# Patient Record
Sex: Male | Born: 1960 | Race: White | Hispanic: No | Marital: Single | Smoking: Current every day smoker
Health system: Southern US, Community
[De-identification: ages and names within clinical notes are randomized; demographics above are authoritative.]

## PROBLEM LIST (undated history)

## (undated) HISTORY — PX: CHOLECYSTECTOMY: SHX55

## (undated) HISTORY — PX: APPENDECTOMY: SHX54

---

## 2017-02-08 ENCOUNTER — Other Ambulatory Visit (HOSPITAL_COMMUNITY): Payer: Self-pay | Admitting: Orthopedic Surgery

## 2017-02-08 DIAGNOSIS — IMO0002 Reserved for concepts with insufficient information to code with codable children: Secondary | ICD-10-CM

## 2017-02-08 DIAGNOSIS — R229 Localized swelling, mass and lump, unspecified: Principal | ICD-10-CM

## 2017-02-10 ENCOUNTER — Encounter (HOSPITAL_COMMUNITY): Payer: Self-pay

## 2017-02-10 ENCOUNTER — Ambulatory Visit (HOSPITAL_COMMUNITY)
Admission: RE | Admit: 2017-02-10 | Discharge: 2017-02-10 | Disposition: A | Discharge status: Home / Self Care | Payer: Self-pay | Source: Ambulatory Visit | Attending: Orthopedic Surgery | Admitting: Orthopedic Surgery

## 2017-02-16 ENCOUNTER — Ambulatory Visit (HOSPITAL_COMMUNITY)
Admission: RE | Admit: 2017-02-16 | Discharge: 2017-02-16 | Disposition: A | Discharge status: Home / Self Care | Payer: Self-pay | Source: Ambulatory Visit | Attending: Orthopedic Surgery | Admitting: Orthopedic Surgery

## 2017-02-16 DIAGNOSIS — R229 Localized swelling, mass and lump, unspecified: Secondary | ICD-10-CM

## 2017-02-16 DIAGNOSIS — R2232 Localized swelling, mass and lump, left upper limb: Secondary | ICD-10-CM | POA: Insufficient documentation

## 2017-02-16 DIAGNOSIS — IMO0002 Reserved for concepts with insufficient information to code with codable children: Secondary | ICD-10-CM

## 2017-04-06 ENCOUNTER — Other Ambulatory Visit: Payer: Self-pay | Admitting: Orthopedic Surgery

## 2017-04-06 ENCOUNTER — Encounter (HOSPITAL_BASED_OUTPATIENT_CLINIC_OR_DEPARTMENT_OTHER): Payer: Self-pay | Admitting: *Deleted

## 2017-04-13 ENCOUNTER — Encounter (HOSPITAL_BASED_OUTPATIENT_CLINIC_OR_DEPARTMENT_OTHER): Payer: Self-pay | Admitting: *Deleted

## 2017-04-13 ENCOUNTER — Ambulatory Visit (HOSPITAL_BASED_OUTPATIENT_CLINIC_OR_DEPARTMENT_OTHER): Payer: Self-pay | Admitting: Anesthesiology

## 2017-04-13 ENCOUNTER — Ambulatory Visit (HOSPITAL_BASED_OUTPATIENT_CLINIC_OR_DEPARTMENT_OTHER)
Admission: RE | Admit: 2017-04-13 | Discharge: 2017-04-13 | Disposition: A | Discharge status: Home / Self Care | Payer: Self-pay | Source: Ambulatory Visit | Attending: Orthopedic Surgery | Admitting: Orthopedic Surgery

## 2017-04-13 ENCOUNTER — Encounter (HOSPITAL_BASED_OUTPATIENT_CLINIC_OR_DEPARTMENT_OTHER)
Admission: RE | Disposition: A | Discharge status: Home / Self Care | Payer: Self-pay | Source: Ambulatory Visit | Attending: Orthopedic Surgery

## 2017-04-13 DIAGNOSIS — L720 Epidermal cyst: Secondary | ICD-10-CM | POA: Insufficient documentation

## 2017-04-13 DIAGNOSIS — Z7982 Long term (current) use of aspirin: Secondary | ICD-10-CM | POA: Insufficient documentation

## 2017-04-13 DIAGNOSIS — F17210 Nicotine dependence, cigarettes, uncomplicated: Secondary | ICD-10-CM | POA: Insufficient documentation

## 2017-04-13 HISTORY — PX: EXCISION MASS UPPER EXTREMETIES: SHX6704

## 2017-04-13 SURGERY — EXCISION MASS UPPER EXTREMITIES
Anesthesia: General | Site: Hand | Laterality: Left

## 2017-04-13 MED ORDER — HYDROCODONE-ACETAMINOPHEN 5-325 MG PO TABS: ORAL_TABLET | ORAL | 0 refills | Status: AC

## 2017-04-13 MED ORDER — KETOROLAC TROMETHAMINE 30 MG/ML IJ SOLN
INTRAMUSCULAR | Status: DC | PRN
  Administered 2017-04-13: 30 mg via INTRAVENOUS

## 2017-04-13 MED ORDER — LIDOCAINE 2% (20 MG/ML) 5 ML SYRINGE
INTRAMUSCULAR | Status: DC | PRN
  Administered 2017-04-13: 100 mg via INTRAVENOUS

## 2017-04-13 MED ORDER — FENTANYL CITRATE (PF) 100 MCG/2ML IJ SOLN
INTRAMUSCULAR | Status: AC
  Filled 2017-04-13: qty 2

## 2017-04-13 MED ORDER — CEFAZOLIN SODIUM-DEXTROSE 2-4 GM/100ML-% IV SOLN
2.0000 g | INTRAVENOUS | Status: AC
  Administered 2017-04-13: 2 g via INTRAVENOUS

## 2017-04-13 MED ORDER — CEFAZOLIN SODIUM-DEXTROSE 2-4 GM/100ML-% IV SOLN
INTRAVENOUS | Status: AC
  Filled 2017-04-13: qty 100

## 2017-04-13 MED ORDER — MIDAZOLAM HCL 2 MG/2ML IJ SOLN
1.0000 mg | INTRAMUSCULAR | Status: DC | PRN
  Administered 2017-04-13: 2 mg via INTRAVENOUS

## 2017-04-13 MED ORDER — HEPARIN SODIUM (PORCINE) 1000 UNIT/ML IJ SOLN
INTRAMUSCULAR | Status: AC
  Filled 2017-04-13: qty 1

## 2017-04-13 MED ORDER — HYDROMORPHONE HCL 1 MG/ML IJ SOLN: 0.2500 mg | INTRAMUSCULAR | Status: DC | PRN

## 2017-04-13 MED ORDER — FENTANYL CITRATE (PF) 100 MCG/2ML IJ SOLN
50.0000 ug | INTRAMUSCULAR | Status: DC | PRN
  Administered 2017-04-13: 100 ug via INTRAVENOUS

## 2017-04-13 MED ORDER — CHLORHEXIDINE GLUCONATE 4 % EX LIQD: 60.0000 mL | Freq: Once | CUTANEOUS | Status: DC

## 2017-04-13 MED ORDER — LACTATED RINGERS IV SOLN
INTRAVENOUS | Status: DC
  Administered 2017-04-13 (×3): via INTRAVENOUS

## 2017-04-13 MED ORDER — BUPIVACAINE HCL (PF) 0.25 % IJ SOLN
INTRAMUSCULAR | Status: AC
  Filled 2017-04-13: qty 90

## 2017-04-13 MED ORDER — MIDAZOLAM HCL 2 MG/2ML IJ SOLN
INTRAMUSCULAR | Status: AC
  Filled 2017-04-13: qty 2

## 2017-04-13 MED ORDER — ONDANSETRON HCL 4 MG/2ML IJ SOLN
INTRAMUSCULAR | Status: AC
  Filled 2017-04-13: qty 2

## 2017-04-13 MED ORDER — OXYCODONE HCL 5 MG PO TABS: 5.0000 mg | ORAL_TABLET | Freq: Once | ORAL | Status: DC | PRN

## 2017-04-13 MED ORDER — DEXAMETHASONE SODIUM PHOSPHATE 10 MG/ML IJ SOLN
INTRAMUSCULAR | Status: AC
  Filled 2017-04-13: qty 1

## 2017-04-13 MED ORDER — MEPERIDINE HCL 25 MG/ML IJ SOLN: 6.2500 mg | INTRAMUSCULAR | Status: DC | PRN

## 2017-04-13 MED ORDER — PROMETHAZINE HCL 25 MG/ML IJ SOLN: 6.2500 mg | INTRAMUSCULAR | Status: DC | PRN

## 2017-04-13 MED ORDER — LIDOCAINE HCL (PF) 1 % IJ SOLN
INTRAMUSCULAR | Status: AC
  Filled 2017-04-13: qty 30

## 2017-04-13 MED ORDER — SCOPOLAMINE 1 MG/3DAYS TD PT72: 1.0000 | MEDICATED_PATCH | Freq: Once | TRANSDERMAL | Status: DC | PRN

## 2017-04-13 MED ORDER — PROPOFOL 10 MG/ML IV BOLUS
INTRAVENOUS | Status: DC | PRN
  Administered 2017-04-13: 200 mg via INTRAVENOUS

## 2017-04-13 MED ORDER — OXYCODONE HCL 5 MG/5ML PO SOLN: 5.0000 mg | Freq: Once | ORAL | Status: DC | PRN

## 2017-04-13 MED ORDER — BUPIVACAINE HCL (PF) 0.25 % IJ SOLN
INTRAMUSCULAR | Status: DC | PRN
  Administered 2017-04-13: 5 mL

## 2017-04-13 MED ORDER — DEXAMETHASONE SODIUM PHOSPHATE 4 MG/ML IJ SOLN
INTRAMUSCULAR | Status: DC | PRN
  Administered 2017-04-13: 10 mg via INTRAVENOUS

## 2017-04-13 MED ORDER — ONDANSETRON HCL 4 MG/2ML IJ SOLN
INTRAMUSCULAR | Status: DC | PRN
  Administered 2017-04-13: 4 mg via INTRAVENOUS

## 2017-04-13 SURGICAL SUPPLY — 56 items
APL SKNCLS STERI-STRIP NONHPOA (GAUZE/BANDAGES/DRESSINGS)
BANDAGE ACE 3X5.8 VEL STRL LF (GAUZE/BANDAGES/DRESSINGS) ×2 IMPLANT
BANDAGE COBAN STERILE 2 (GAUZE/BANDAGES/DRESSINGS) ×2 IMPLANT
BENZOIN TINCTURE PRP APPL 2/3 (GAUZE/BANDAGES/DRESSINGS) IMPLANT
BLADE MINI RND TIP GREEN BEAV (BLADE) IMPLANT
BLADE SURG 15 STRL LF DISP TIS (BLADE) ×2 IMPLANT
BLADE SURG 15 STRL SS (BLADE) ×6
BNDG CMPR 9X4 STRL LF SNTH (GAUZE/BANDAGES/DRESSINGS) ×1
BNDG COHESIVE 1X5 TAN STRL LF (GAUZE/BANDAGES/DRESSINGS) IMPLANT
BNDG CONFORM 2 STRL LF (GAUZE/BANDAGES/DRESSINGS) IMPLANT
BNDG ELASTIC 2X5.8 VLCR STR LF (GAUZE/BANDAGES/DRESSINGS) IMPLANT
BNDG ESMARK 4X9 LF (GAUZE/BANDAGES/DRESSINGS) ×2 IMPLANT
BNDG GAUZE 1X2.1 STRL (MISCELLANEOUS) IMPLANT
BNDG GAUZE ELAST 4 BULKY (GAUZE/BANDAGES/DRESSINGS) ×2 IMPLANT
BNDG PLASTER X FAST 3X3 WHT LF (CAST SUPPLIES) IMPLANT
BNDG PLSTR 9X3 FST ST WHT (CAST SUPPLIES)
CHLORAPREP W/TINT 26ML (MISCELLANEOUS) ×3 IMPLANT
CLOSURE WOUND 1/2 X4 (GAUZE/BANDAGES/DRESSINGS)
CORD BIPOLAR FORCEPS 12FT (ELECTRODE) ×3 IMPLANT
COVER BACK TABLE 60X90IN (DRAPES) ×3 IMPLANT
COVER MAYO STAND STRL (DRAPES) ×3 IMPLANT
CUFF TOURNIQUET SINGLE 18IN (TOURNIQUET CUFF) ×3 IMPLANT
DRAPE EXTREMITY T 121X128X90 (DRAPE) ×3 IMPLANT
DRAPE SURG 17X23 STRL (DRAPES) ×3 IMPLANT
GAUZE SPONGE 4X4 12PLY STRL (GAUZE/BANDAGES/DRESSINGS) ×3 IMPLANT
GAUZE XEROFORM 1X8 LF (GAUZE/BANDAGES/DRESSINGS) ×3 IMPLANT
GLOVE BIO SURGEON STRL SZ7 (GLOVE) ×2 IMPLANT
GLOVE BIO SURGEON STRL SZ7.5 (GLOVE) ×3 IMPLANT
GLOVE BIOGEL PI IND STRL 8 (GLOVE) ×1 IMPLANT
GLOVE BIOGEL PI INDICATOR 8 (GLOVE) ×2
GLOVE EXAM NITRILE MD LF STRL (GLOVE) ×2 IMPLANT
GOWN STRL REUS W/ TWL LRG LVL3 (GOWN DISPOSABLE) ×1 IMPLANT
GOWN STRL REUS W/ TWL XL LVL3 (GOWN DISPOSABLE) IMPLANT
GOWN STRL REUS W/TWL LRG LVL3 (GOWN DISPOSABLE)
GOWN STRL REUS W/TWL XL LVL3 (GOWN DISPOSABLE) ×6 IMPLANT
NDL HYPO 25X1 1.5 SAFETY (NEEDLE) ×1 IMPLANT
NEEDLE HYPO 25X1 1.5 SAFETY (NEEDLE) ×3 IMPLANT
NS IRRIG 1000ML POUR BTL (IV SOLUTION) ×3 IMPLANT
PACK BASIN DAY SURGERY FS (CUSTOM PROCEDURE TRAY) ×3 IMPLANT
PAD CAST 3X4 CTTN HI CHSV (CAST SUPPLIES) IMPLANT
PAD CAST 4YDX4 CTTN HI CHSV (CAST SUPPLIES) IMPLANT
PADDING CAST ABS 4INX4YD NS (CAST SUPPLIES) ×2
PADDING CAST ABS COTTON 4X4 ST (CAST SUPPLIES) ×1 IMPLANT
PADDING CAST COTTON 3X4 STRL (CAST SUPPLIES) ×3
PADDING CAST COTTON 4X4 STRL (CAST SUPPLIES)
STOCKINETTE 4X48 STRL (DRAPES) ×3 IMPLANT
STRIP CLOSURE SKIN 1/2X4 (GAUZE/BANDAGES/DRESSINGS) IMPLANT
SUT ETHILON 3 0 PS 1 (SUTURE) IMPLANT
SUT ETHILON 4 0 PS 2 18 (SUTURE) ×3 IMPLANT
SUT ETHILON 5 0 P 3 18 (SUTURE)
SUT NYLON ETHILON 5-0 P-3 1X18 (SUTURE) IMPLANT
SUT VIC AB 4-0 P2 18 (SUTURE) IMPLANT
SYR BULB 3OZ (MISCELLANEOUS) ×3 IMPLANT
SYR CONTROL 10ML LL (SYRINGE) ×3 IMPLANT
TOWEL OR 17X24 6PK STRL BLUE (TOWEL DISPOSABLE) ×6 IMPLANT
UNDERPAD 30X30 (UNDERPADS AND DIAPERS) ×3 IMPLANT

## 2017-04-13 NOTE — Brief Op Note (Signed)
04/13/2017  3:34 PM  PATIENT:  Sean LinseyJoseph V Busk  56 y.o. male  PRE-OPERATIVE DIAGNOSIS:  LEFT HAND MASS R22.32  POST-OPERATIVE DIAGNOSIS:  LEFT HAND MASS  PROCEDURE:  Procedure(s): EXCISION MASS LEFT HAND (Left)  SURGEON:  Surgeon(s) and Role:    * Betha LoaKuzma, Ajooni Karam, MD - Primary  PHYSICIAN ASSISTANT:   ASSISTANTS: none   ANESTHESIA:   general  EBL:  minimal  BLOOD ADMINISTERED:none  DRAINS: none   LOCAL MEDICATIONS USED:  MARCAINE     SPECIMEN:  Source of Specimen:  left palm  DISPOSITION OF SPECIMEN:  PATHOLOGY  COUNTS:  YES  TOURNIQUET:   Total Tourniquet Time Documented: Upper Arm (Left) - 26 minutes Total: Upper Arm (Left) - 26 minutes   DICTATION: .Other Dictation: Dictation Number [id number]  PLAN OF CARE: Discharge to home after PACU  PATIENT DISPOSITION:  PACU - hemodynamically stable.

## 2017-04-13 NOTE — Op Note (Signed)
[id number] 

## 2017-04-13 NOTE — H&P (Signed)
  Ancil LinseyJoseph V Hubbert is an 56 y.o. male.   Chief Complaint: left palm mass HPI: 56 yo male with mass in left palm x 5 years.  It is bothersome to him and he wishes to have it removed.  Allergies: No Known Allergies  History reviewed. No pertinent past medical history.  Past Surgical History:  Procedure Laterality Date  . APPENDECTOMY    . CHOLECYSTECTOMY      Family History: History reviewed. No pertinent family history.  Social History:   reports that he has been smoking.  He has never used smokeless tobacco. He reports that he does not drink alcohol or use drugs.  Medications: Medications Prior to Admission  Medication Sig Dispense Refill  . aspirin-acetaminophen-caffeine (EXCEDRIN MIGRAINE) 250-250-65 MG tablet Take by mouth every 6 (six) hours as needed for headache.      No results found for this or any previous visit (from the past 48 hour(s)).  No results found.   A comprehensive review of systems was negative.  Blood pressure 125/77, pulse 83, temperature 98.5 F (36.9 C), temperature source Oral, resp. rate 18, height 5\' 10"  (1.778 m), weight 104.8 kg (231 lb), SpO2 96 %.  General appearance: alert, cooperative and appears stated age Head: Normocephalic, without obvious abnormality, atraumatic Neck: supple, symmetrical, trachea midline Resp: clear to auscultation bilaterally Cardio: regular rate and rhythm GI: non-tender Extremities: Intact sensation and capillary refill all digits.  +epl/fpl/io.  No wounds.  Pulses: 2+ and symmetric Skin: Skin color, texture, turgor normal. No rashes or lesions Neurologic: Grossly normal Incision/Wound:none  Assessment/Plan Left palm mass.  Non operative and operative treatment options were discussed with the patient and patient wishes to proceed with operative treatment. Risks, benefits, and alternatives of surgery were discussed and the patient agrees with the plan of care.   Zaylynn Rickett R 04/13/2017, 1:43 PM

## 2017-04-13 NOTE — Anesthesia Preprocedure Evaluation (Signed)
Anesthesia Evaluation  Patient identified by MRN, date of birth, ID band Patient awake    Reviewed: Allergy & Precautions, NPO status , Patient's Chart, lab work & pertinent test results  Airway Mallampati: II  TM Distance: >3 FB Neck ROM: Full    Dental no notable dental hx.    Pulmonary neg pulmonary ROS, Current Smoker,    Pulmonary exam normal breath sounds clear to auscultation       Cardiovascular negative cardio ROS Normal cardiovascular exam Rhythm:Regular Rate:Normal     Neuro/Psych negative neurological ROS  negative psych ROS   GI/Hepatic negative GI ROS, Neg liver ROS,   Endo/Other  negative endocrine ROS  Renal/GU negative Renal ROS     Musculoskeletal negative musculoskeletal ROS (+)   Abdominal   Peds  Hematology negative hematology ROS (+)   Anesthesia Other Findings   Reproductive/Obstetrics                             Anesthesia Physical Anesthesia Plan  ASA: II  Anesthesia Plan: General   Post-op Pain Management:    Induction: Intravenous  PONV Risk Score and Plan: 1 and Ondansetron, Dexamethasone and Midazolam  Airway Management Planned: LMA  Additional Equipment:   Intra-op Plan:   Post-operative Plan: Extubation in OR  Informed Consent: I have reviewed the patients History and Physical, chart, labs and discussed the procedure including the risks, benefits and alternatives for the proposed anesthesia with the patient or authorized representative who has indicated his/her understanding and acceptance.   Dental advisory given  Plan Discussed with: CRNA  Anesthesia Plan Comments:         Anesthesia Quick Evaluation

## 2017-04-13 NOTE — Op Note (Signed)
NAMCharlett Nose:  Woodard, Sean               ACCOUNT NO.:  [id number]  MEDICAL RECORD NO.:  [id number]  LOCATION:                                 FACILITY:  PHYSICIAN:  Betha LoaKevin Finola Rosal, MD             DATE OF BIRTH:  DATE OF PROCEDURE:  04/13/2017 DATE OF DISCHARGE:                              OPERATIVE REPORT   POSTOPERATIVE DIAGNOSIS:  Left palm mass.  POSTOP DIAGNOSIS:  Left palm inclusion cyst, 23 mm.  PROCEDURE:  Excision of subcutaneous mass left palm, 23 mm.  SURGEON:  Betha LoaKevin Floree Zuniga, MD.  ASSISTANT:  None.  ANESTHESIA:  General.  IV FLUIDS:  Per anesthesia flow sheet.  ESTIMATED BLOOD LOSS:  Minimal.  COMPLICATIONS:  None.  SPECIMENS:  Left palm mass to Pathology.  TOURNIQUET TIME:  26 minutes.  DISPOSITION:  Stable to PACU.  INDICATIONS:  Mr. Sean Woodard is a 56 year old male, who has noted a mass in his left palm for several years.  It is bothersome to him.  He wishes to have it removed.  Risks, benefits, and alternatives of surgery were discussed including risk of blood loss; infection; damage to nerves, vessels, tendons, ligaments, bone; failure of surgery; need for additional surgery; complications with wound healing; continued pain; and recurrence of mass.  He voiced understanding of these risks and elected to proceed.  OPERATIVE COURSE:  After being identified preoperatively by myself, the patient and I agreed upon the procedure and site of procedure.  Surgical site was marked.  The risks, benefits, and alternatives of surgery were reviewed and he wished to proceed.  Surgical consent had been signed. He was given IV Ancef as preoperative antibiotic prophylaxis.  He was transported to the operating room and placed on the operating room table in supine position with left upper extremity on arm board.  General anesthesia was induced by anesthesiologist.  Left upper extremity was prepped and draped in normal sterile orthopedic fashion.  Surgical pause was performed between  surgeons, Anesthesia, and operating room staff; and all were in agreement as to the patient, procedure, and site of procedure.  Tourniquet at proximal aspect of the extremity was inflated to 250 mmHg after exsanguination of the limb with an Esmarch bandage. Incision was made over the mass in an oblique fashion.  This was carried into subcutaneous tissues by spreading technique.  Bipolar electrocautery was used to obtain hemostasis.  The mass was deep to the palmar fascia.  It was carefully freed up from all surrounding adhesions.  It was well encapsulated.  It was pearly in appearance.  The digital nerve and artery to the ulnar side of the index finger and radial side of the long finger were identified and were intact.  The mass was removed in its entirety.  It measured 23 mm in diameter.  It was filled with a creamy whitish substance.  It was sent to Pathology for examination.  The wound was copiously irrigated with sterile saline. Some skin from the ulnar-sided flap was removed to reduce excess skin. The skin was then reapproximated using 4-0 nylon suture in a horizontal mattress fashion.  The wound was injected with 5 mL of  0.25% plain Marcaine to aid in postoperative analgesia.  It was then dressed with sterile Xeroform, 4x4s, and wrapped with Kerlix and a Coban dressing lightly.  Tourniquet was deflated at 26 minutes.  Fingertips were pink with brisk capillary refill after deflation of tourniquet.  The operative drapes were broken down.  The patient was awoken from anesthesia safely.  He was transferred back to the stretcher and taken to PACU in stable condition.  I will see him back in the office in 1 week for postoperative followup.  He was given Norco 5/325 one to two p.o. q.6 hours p.r.n. pain, dispensed #20.     Betha Loa, MD     KK/MEDQ  D:  04/13/2017  T:  04/13/2017  Job:  [id number]

## 2017-04-13 NOTE — Discharge Instructions (Addendum)

## 2017-04-13 NOTE — Anesthesia Procedure Notes (Signed)
Procedure Name: LMA Insertion Date/Time: 04/13/2017 2:54 PM Performed by: Gar GibbonKEETON, Daiwik Buffalo S Pre-anesthesia Checklist: Patient identified, Emergency Drugs available, Suction available and Patient being monitored Patient Re-evaluated:Patient Re-evaluated prior to induction Oxygen Delivery Method: Circle system utilized Preoxygenation: Pre-oxygenation with 100% oxygen Induction Type: IV induction Ventilation: Mask ventilation without difficulty LMA: LMA inserted LMA Size: 4.0 Number of attempts: 1 Airway Equipment and Method: Bite block Placement Confirmation: positive ETCO2 Tube secured with: Tape Dental Injury: Teeth and Oropharynx as per pre-operative assessment

## 2017-04-13 NOTE — Transfer of Care (Signed)
Immediate Anesthesia Transfer of Care Note  Patient: Ancil LinseyJoseph V Fehring  Procedure(s) Performed: EXCISION MASS LEFT HAND (Left Hand)  Patient Location: PACU  Anesthesia Type:General  Level of Consciousness: sedated  Airway & Oxygen Therapy: Patient Spontanous Breathing and Patient connected to face mask oxygen  Post-op Assessment: Report given to RN and Post -op Vital signs reviewed and stable  Post vital signs: Reviewed and stable  Last Vitals:  Vitals:   04/13/17 1539 04/13/17 1540  BP: (!) 90/55   Pulse: 72 71  Resp:  17  Temp:    SpO2: 96% 95%    Last Pain:  Vitals:   04/13/17 1033  TempSrc: Oral      Patients Stated Pain Goal: 0 (04/13/17 1033)  Complications: No apparent anesthesia complications

## 2017-04-14 ENCOUNTER — Encounter (HOSPITAL_BASED_OUTPATIENT_CLINIC_OR_DEPARTMENT_OTHER): Payer: Self-pay | Admitting: Orthopedic Surgery

## 2017-04-14 NOTE — Anesthesia Postprocedure Evaluation (Signed)
Anesthesia Post Note  Patient: Sean Woodard  Procedure(s) Performed: EXCISION MASS LEFT HAND (Left Hand)     Patient location during evaluation: PACU Anesthesia Type: General Level of consciousness: sedated and patient cooperative Pain management: pain level controlled Vital Signs Assessment: post-procedure vital signs reviewed and stable Respiratory status: spontaneous breathing Cardiovascular status: stable Anesthetic complications: no    Last Vitals:  Vitals:   04/13/17 1615 04/13/17 1623  BP: 126/83 126/68  Pulse: 76 80  Resp: 20 18  Temp:  36.8 C  SpO2: 95% 96%    Last Pain:  Vitals:   04/13/17 1623  TempSrc: Oral  PainSc: 0-No pain                 Lewie LoronJohn Marquerite Forsman

## 2018-12-05 IMAGING — US US EXTREM UP*L* LTD
1 series · 14 of 16 positions shown · non-contrast
Comparison: None.

CLINICAL DATA: Left hand mass.

EXAM:
ULTRASOUND LEFT UPPER EXTREMITY LIMITED
TECHNIQUE: Ultrasound examination of the upper extremity soft tissues was
performed in the area of clinical concern.

[Series 1: us extrem up*left* ltd · 0.06mm/px · 16 acquisitions, 14 frames shown]
[im 1/16]
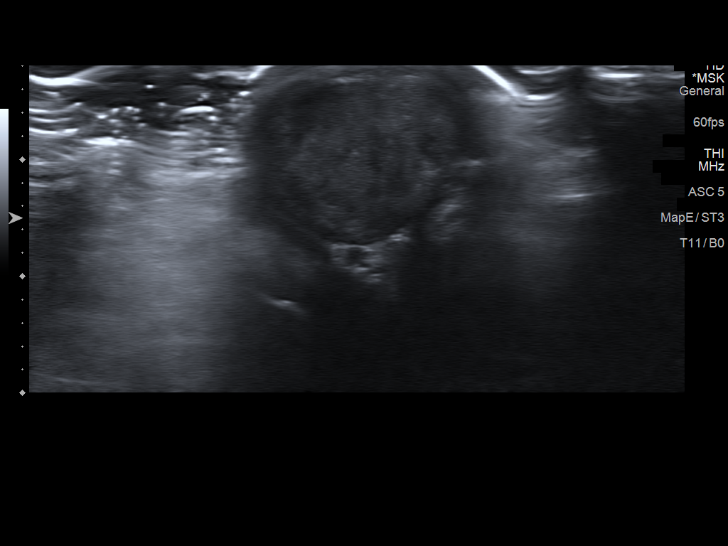
[im 2/16]
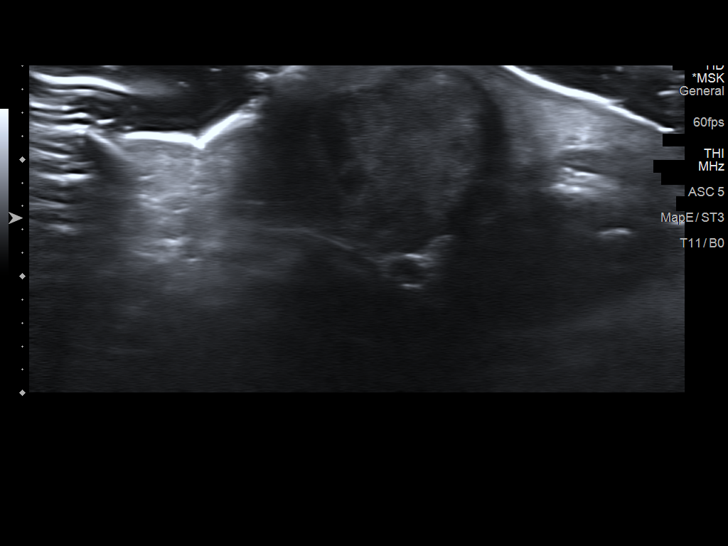
[im 3/16]
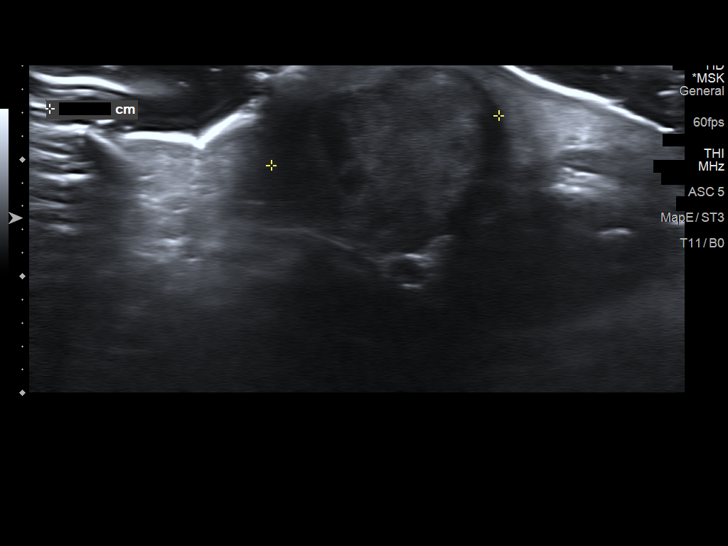
[im 5/16]
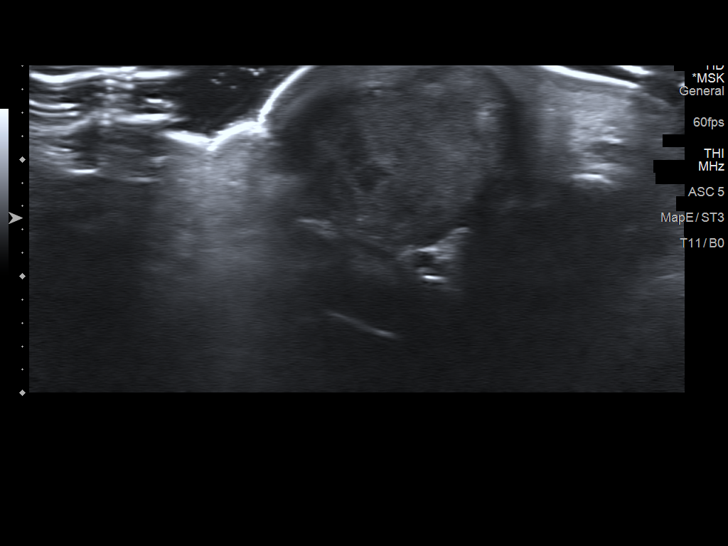
[im 6/16]
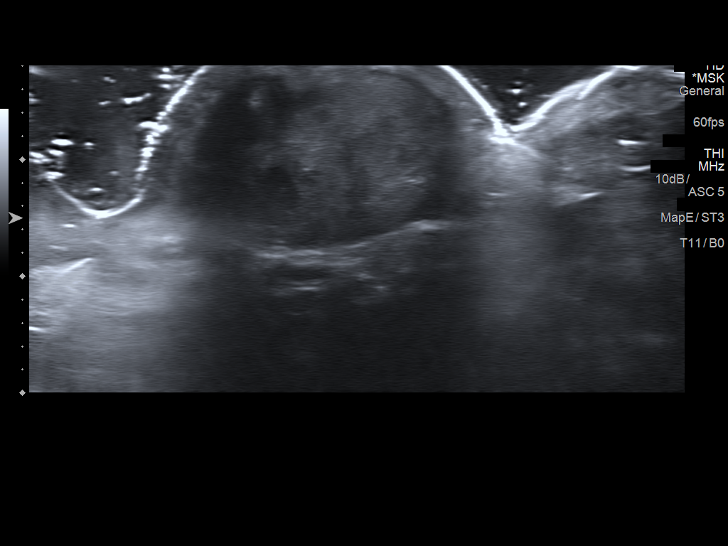
[im 7/16]
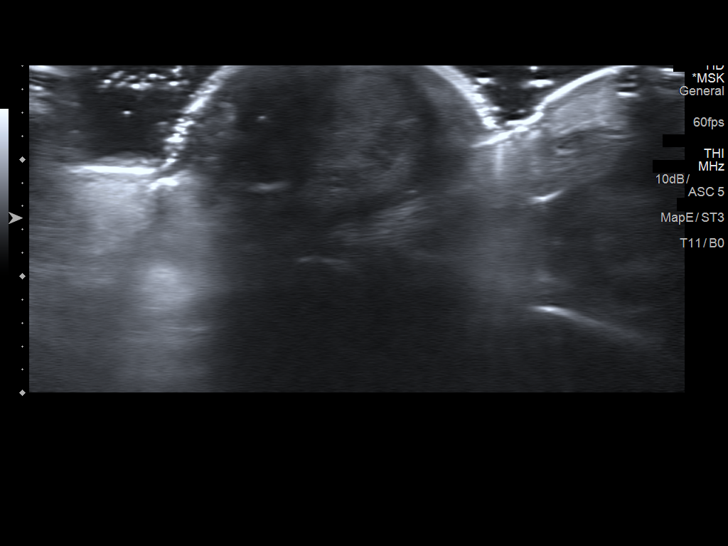
[im 8/16]
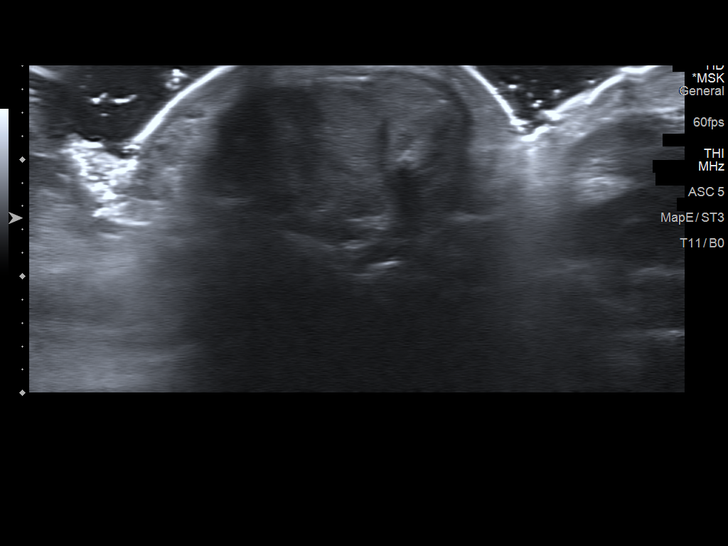
[im 9/16]
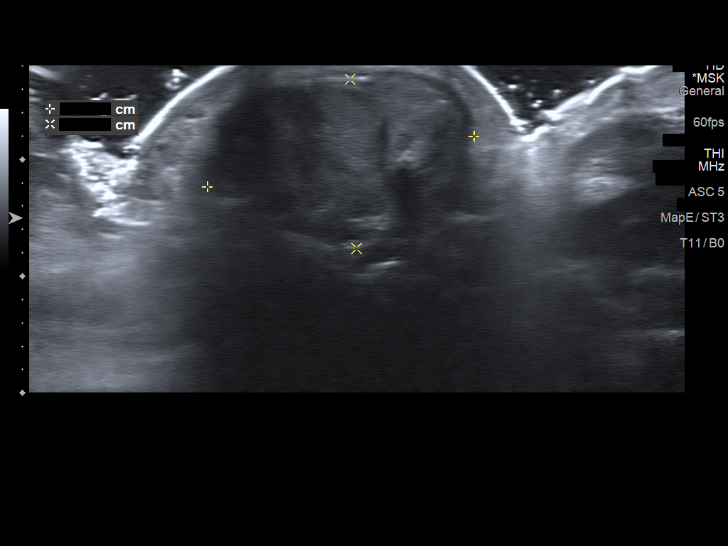
[im 10/16]
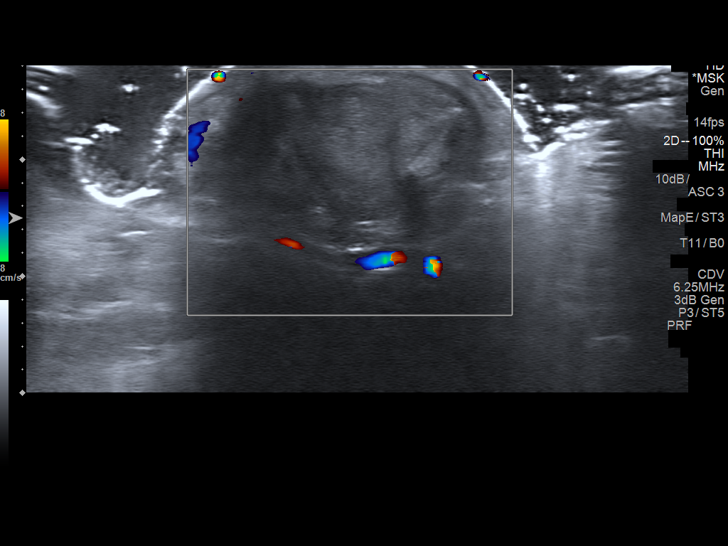
[im 11/16]
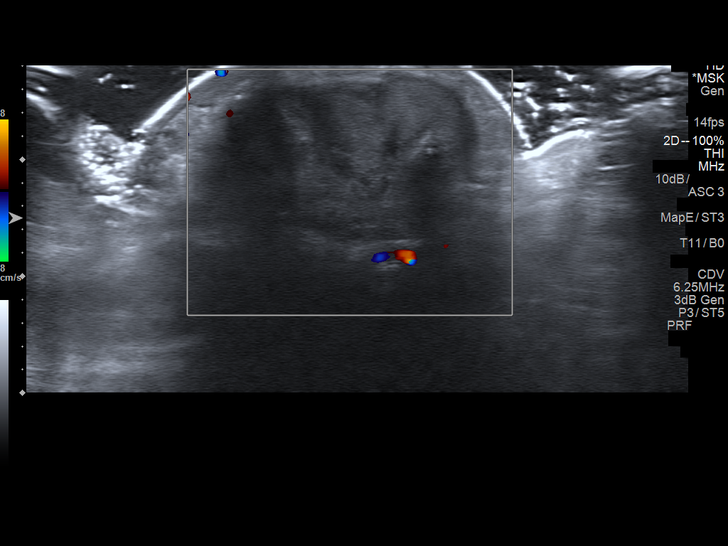
[im 13/16]
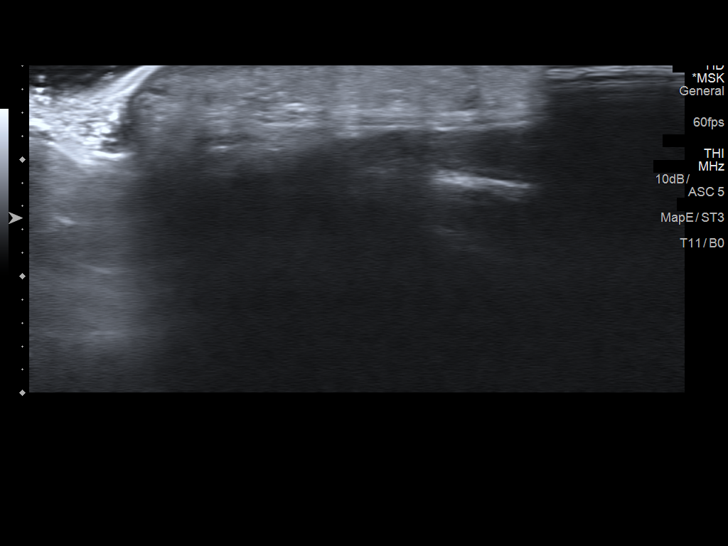
[im 14/16]
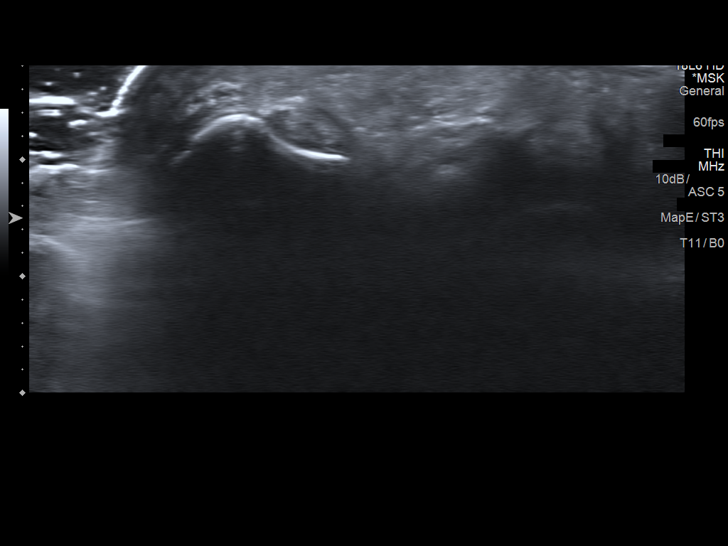
[im 15/16]
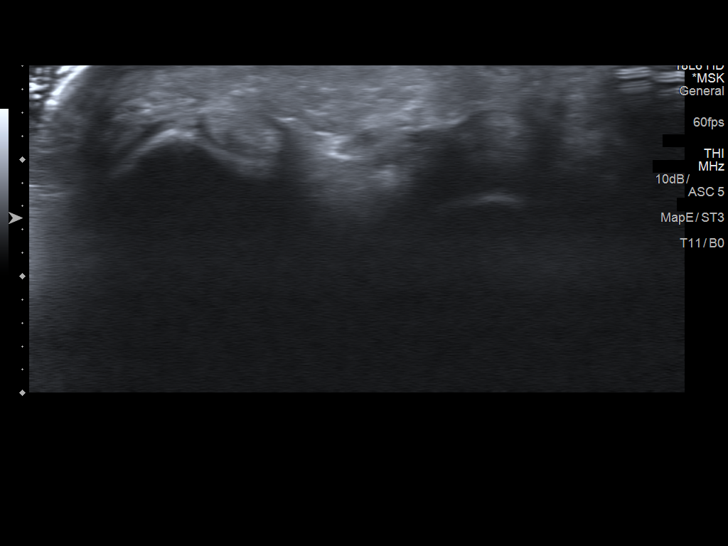
[im 16/16]
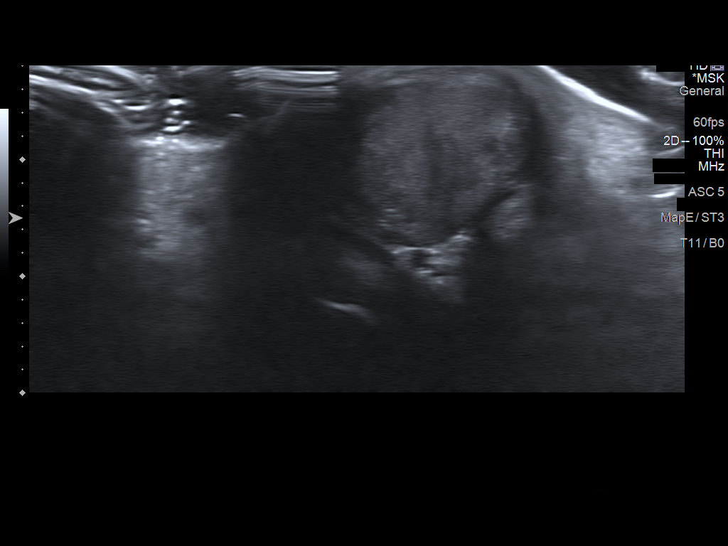

[14 of 16 positions shown; findings below may reference images not displayed]

FINDINGS: On the dorsal hand between fourth and fifth metacarpal joints, there
is a solid heterogeneous, hypoechoic mass measuring 2.3 x 1.5 x
cm. There is no internal vascularity.
IMPRESSION: Solid, heterogeneous 2.3 cm mass on the dorsal left hand between the
fourth and fifth metacarpal joints, nonspecific. Recommend further
evaluation with contrast-enhanced MRI.
